# Patient Record
Sex: Female | Born: 2000 | Race: Black or African American | Hispanic: No | Marital: Single | Smoking: Never smoker
Health system: Southern US, Community
[De-identification: ages and names within clinical notes are randomized; demographics above are authoritative.]

## PROBLEM LIST (undated history)

## (undated) DIAGNOSIS — L309 Dermatitis, unspecified: Secondary | ICD-10-CM

---
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
## 2019-05-04 ENCOUNTER — Encounter: Payer: Self-pay | Admitting: Gynecology

## 2019-06-08 ENCOUNTER — Other Ambulatory Visit: Payer: Self-pay

## 2019-06-09 ENCOUNTER — Encounter: Payer: Self-pay | Admitting: Women's Health

## 2019-06-09 ENCOUNTER — Ambulatory Visit: Payer: PRIVATE HEALTH INSURANCE | Admitting: Women's Health

## 2019-06-09 VITALS — BP 120/80

## 2019-06-09 DIAGNOSIS — Z113 Encounter for screening for infections with a predominantly sexual mode of transmission: Secondary | ICD-10-CM

## 2019-06-09 DIAGNOSIS — N898 Other specified noninflammatory disorders of vagina: Secondary | ICD-10-CM | POA: Diagnosis not present

## 2019-06-09 LAB — WET PREP FOR TRICH, YEAST, CLUE

## 2019-06-09 MED ORDER — FLUCONAZOLE 150 MG PO TABS: 150.0000 mg | ORAL_TABLET | Freq: Once | ORAL | 1 refills | Status: AC

## 2019-06-09 NOTE — Patient Instructions (Signed)
Vaginal Yeast infection, Adult  Vaginal yeast infection is a condition that causes vaginal discharge as well as soreness, swelling, and redness (inflammation) of the vagina. This is a common condition. Some women get this infection frequently. What are the causes? This condition is caused by a change in the normal balance of the yeast (candida) and bacteria that live in the vagina. This change causes an overgrowth of yeast, which causes the inflammation. What increases the risk? The condition is more likely to develop in women who:  Take antibiotic medicines.  Have diabetes.  Take birth control pills.  Are pregnant.  Douche often.  Have a weak body defense system (immune system).  Have been taking steroid medicines for a long time.  Frequently wear tight clothing. What are the signs or symptoms? Symptoms of this condition include:  White, thick, creamy vaginal discharge.  Swelling, itching, redness, and irritation of the vagina. The lips of the vagina (vulva) may be affected as well.  Pain or a burning feeling while urinating.  Pain during sex. How is this diagnosed? This condition is diagnosed based on:  Your medical history.  A physical exam.  A pelvic exam. Your health care provider will examine a sample of your vaginal discharge under a microscope. Your health care provider may send this sample for testing to confirm the diagnosis. How is this treated? This condition is treated with medicine. Medicines may be over-the-counter or prescription. You may be told to use one or more of the following:  Medicine that is taken by mouth (orally).  Medicine that is applied as a cream (topically).  Medicine that is inserted directly into the vagina (suppository). Follow these instructions at home:  Lifestyle  Do not have sex until your health care provider approves. Tell your sex partner that you have a yeast infection. That person should go to his or her health care  provider and ask if they should also be treated.  Do not wear tight clothes, such as pantyhose or tight pants.  Wear breathable cotton underwear. General instructions  Take or apply over-the-counter and prescription medicines only as told by your health care provider.  Eat more yogurt. This may help to keep your yeast infection from returning.  Do not use tampons until your health care provider approves.  Try taking a sitz bath to help with discomfort. This is a warm water bath that is taken while you are sitting down. The water should only come up to your hips and should cover your buttocks. Do this 3-4 times per day or as told by your health care provider.  Do not douche.  If you have diabetes, keep your blood sugar levels under control.  Keep all follow-up visits as told by your health care provider. This is important. Contact a health care provider if:  You have a fever.  Your symptoms go away and then return.  Your symptoms do not get better with treatment.  Your symptoms get worse.  You have new symptoms.  You develop blisters in or around your vagina.  You have blood coming from your vagina and it is not your menstrual period.  You develop pain in your abdomen. Summary  Vaginal yeast infection is a condition that causes discharge as well as soreness, swelling, and redness (inflammation) of the vagina.  This condition is treated with medicine. Medicines may be over-the-counter or prescription.  Take or apply over-the-counter and prescription medicines only as told by your health care provider.  Do not douche.   Do not have sex or use tampons until your health care provider approves.  Contact a health care provider if your symptoms do not get better with treatment or your symptoms go away and then return. This information is not intended to replace advice given to you by your health care provider. Make sure you discuss any questions you have with your health care  provider. Document Released: 05/09/2005 Document Revised: 12/16/2017 Document Reviewed: 12/16/2017 Elsevier Patient Education  2020 [employer] Inc[employer]  

## 2019-06-09 NOTE — Progress Notes (Signed)
18 year old S BF G0 presents with complaint of white vaginal discharge with odor for 1 week and requesting STD screen, new partner.  10/2018 Nexplanon amenorrheic, second Nexplanon.  Denies urinary symptoms of frequency, pain/ burning, back/abdominal pain or fever.  Gardasil series completed.  Student at [employer], doing well..  Exam: Appears well.  Obese.  No CVAT or abdominal pain with palpation.  External genitalia within normal limits, speculum exam scant white discharge, wet prep positive for yeast, GC/chlamydia culture taken.  Bimanual no CMT tenderness.  Yeast vaginitis STD screen  Plan: Diflucan 150 p.o. today repeat in 3 days if necessary prescription, proper use given and reviewed.  Yeast prevention discussed.  Encouraged condoms until permanent partner.  Aware of importance of increasing exercise and decreasing calories/carbs.  GC/chlamydia culture pending, HIV, RPR.

## 2019-06-10 LAB — GC PROBE AMP THINPREP: N. gonorrhoeae RNA, TMA: NOT DETECTED

## 2019-10-26 ENCOUNTER — Other Ambulatory Visit: Payer: Self-pay

## 2019-10-26 ENCOUNTER — Encounter: Payer: Self-pay | Admitting: Women's Health

## 2019-10-26 ENCOUNTER — Ambulatory Visit: Payer: PRIVATE HEALTH INSURANCE | Admitting: Women's Health

## 2019-10-26 VITALS — BP 122/80 | Ht 62.0 in | Wt 292.0 lb

## 2019-10-26 DIAGNOSIS — Z113 Encounter for screening for infections with a predominantly sexual mode of transmission: Secondary | ICD-10-CM

## 2019-10-26 DIAGNOSIS — R5383 Other fatigue: Secondary | ICD-10-CM | POA: Diagnosis not present

## 2019-10-26 DIAGNOSIS — Z01419 Encounter for gynecological examination (general) (routine) without abnormal findings: Secondary | ICD-10-CM | POA: Diagnosis not present

## 2019-10-26 NOTE — Progress Notes (Signed)
Donovan Gatchel 06-13-2001 [id number]    History:    Presents for annual exam.  Irregular spotting on Nexplanon placed 10/2018.  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  No feelings of self-harm.  Gardasil series completed.  Negative GC/chlamydia 05/2019 same partner.    Past medical history, past surgical history, family history and social history were all reviewed and documented in the EPIC chart.  Parents also obese, mother hypertension.  ROS:  A ROS was performed and pertinent positives and negatives are included.  Exam:  Vitals:   10/26/19 1421  BP: 122/80  Weight: 292 lb (132.5 kg)  Height: 5\' 2"  (1.575 m)   Body mass index is 53.41 kg/m.   General appearance:  Normal Thyroid:  Symmetrical, normal in size, without palpable masses or nodularity. Respiratory  Auscultation:  Clear without wheezing or rhonchi Cardiovascular  Auscultation:  Regular rate, without rubs, murmurs or gallops  Edema/varicosities:  Not grossly evident Abdominal  Soft,nontender, without masses, guarding or rebound.  Liver/spleen:  No organomegaly noted  Hernia:  None appreciated  Skin  Inspection:  Grossly normal   Breasts: Examined lying and sitting.     Right: Without masses, retractions, discharge or axillary adenopathy.     Left: Without masses, retractions, discharge or axillary adenopathy. Gentitourinary   Inguinal/mons:  Normal without inguinal adenopathy  External genitalia:  Normal  BUS/Urethra/Skene's glands:  Normal  Vagina:  Normal  Cervix:  Normal  Uterus:   normal in size, shape and contour.  Midline and mobile  Adnexa/parametria:     Rt: Without masses or tenderness.   Lt: Without masses or tenderness.  Anus and perineum: Normal    Assessment/Plan:  19 y.o. S BF G0  for annual exam with no GYN complaints but reports questionable depression, fatigue, tearfulness mostly in regard to situational stressors of school.  10/2018 Nexplanon rare bleeding Obesity  Plan: Encourage counseling, instructed to see if any counseling is available for University to help with career choices.  Self-care, leisure activities encouraged.  Reviewed most likely tearfulness and anxiety related to uncertain career path.  Aware of need to decrease calories/carbs, increase daily walking.  CBC, TSH, HIV, RPR.  Denies need for repeat GC/chlamydia same partner.    11/2018 [facility], 6:28 PM 10/26/2019

## 2019-10-26 NOTE — Patient Instructions (Signed)
MVI Take walk daily Health Maintenance, Female Adopting a healthy lifestyle and getting preventive care are important in promoting health and wellness. Ask your health care provider about:  The right schedule for you to have regular tests and exams.  Things you can do on your own to prevent diseases and keep yourself healthy. What should I know about diet, weight, and exercise? Eat a healthy diet   Eat a diet that includes plenty of vegetables, fruits, low-fat dairy products, and lean protein.  Do not eat a lot of foods that are high in solid fats, added sugars, or sodium. Maintain a healthy weight Body mass index (BMI) is used to identify weight problems. It estimates body fat based on height and weight. Your health care provider can help determine your BMI and help you achieve or maintain a healthy weight. Get regular exercise Get regular exercise. This is one of the most important things you can do for your health. Most adults should:  Exercise for at least 150 minutes each week. The exercise should increase your heart rate and make you sweat (moderate-intensity exercise).  Do strengthening exercises at least twice a week. This is in addition to the moderate-intensity exercise.  Spend less time sitting. Even light physical activity can be beneficial. Watch cholesterol and blood lipids Have your blood tested for lipids and cholesterol at 20 years of age, then have this test every 5 years. Have your cholesterol levels checked more often if:  Your lipid or cholesterol levels are high.  You are older than 20 years of age.  You are at high risk for heart disease. What should I know about cancer screening? Depending on your health history and family history, you may need to have cancer screening at various ages. This may include screening for:  Breast cancer.  Cervical cancer.  Colorectal cancer.  Skin cancer.  Lung cancer. What should I know about heart disease, diabetes,  and high blood pressure? Blood pressure and heart disease  High blood pressure causes heart disease and increases the risk of stroke. This is more likely to develop in people who have high blood pressure readings, are of African descent, or are overweight.  Have your blood pressure checked: ? Every 3-5 years if you are 67-67 years of age. ? Every year if you are 40 years old or older. Diabetes Have regular diabetes screenings. This checks your fasting blood sugar level. Have the screening done:  Once every three years after age 61 if you are at a normal weight and have a low risk for diabetes.  More often and at a younger age if you are overweight or have a high risk for diabetes. What should I know about preventing infection? Hepatitis B If you have a higher risk for hepatitis B, you should be screened for this virus. Talk with your health care provider to find out if you are at risk for hepatitis B infection. Hepatitis C Testing is recommended for:  Everyone born from [birth year] through 1966.  Anyone with known risk factors for hepatitis C. Sexually transmitted infections (STIs)  Get screened for STIs, including gonorrhea and chlamydia, if: ? You are sexually active and are younger than 20 years of age. ? You are older than 20 years of age and your health care provider tells you that you are at risk for this type of infection. ? Your sexual activity has changed since you were last screened, and you are at increased risk for chlamydia or gonorrhea. Ask your  health care provider if you are at risk.  Ask your health care provider about whether you are at high risk for HIV. Your health care provider may recommend a prescription medicine to help prevent HIV infection. If you choose to take medicine to prevent HIV, you should first get tested for HIV. You should then be tested every 3 months for as long as you are taking the medicine. Pregnancy  If you are about to stop having your period  (premenopausal) and you may become pregnant, seek counseling before you get pregnant.  Take 400 to 800 micrograms (mcg) of folic acid every day if you become pregnant.  Ask for birth control (contraception) if you want to prevent pregnancy. Osteoporosis and menopause Osteoporosis is a disease in which the bones lose minerals and strength with aging. This can result in bone fractures. If you are 26 years old or older, or if you are at risk for osteoporosis and fractures, ask your health care provider if you should:  Be screened for bone loss.  Take a calcium or vitamin D supplement to lower your risk of fractures.  Be given hormone replacement therapy (HRT) to treat symptoms of menopause. Follow these instructions at home: Lifestyle  Do not use any products that contain nicotine or tobacco, such as cigarettes, e-cigarettes, and chewing tobacco. If you need help quitting, ask your health care provider.  Do not use street drugs.  Do not share needles.  Ask your health care provider for help if you need support or information about quitting drugs. Alcohol use  Do not drink alcohol if: ? Your health care provider tells you not to drink. ? You are pregnant, may be pregnant, or are planning to become pregnant.  If you drink alcohol: ? Limit how much you use to 0-1 drink a day. ? Limit intake if you are breastfeeding.  Be aware of how much alcohol is in your drink. In the [place] one drink equals one 12 oz bottle of beer (355 mL), one 5 oz glass of wine (148 mL), or one 1 oz glass of hard liquor (44 mL). General instructions  Schedule regular health, dental, and eye exams.  Stay current with your vaccines.  Tell your health care provider if: ? You often feel depressed. ? You have ever been abused or do not feel safe at home. Summary  Adopting a healthy lifestyle and getting preventive care are important in promoting health and wellness.  Follow your health care provider's  instructions about healthy diet, exercising, and getting tested or screened for diseases.  Follow your health care provider's instructions on monitoring your cholesterol and blood pressure. This information is not intended to replace advice given to you by your health care provider. Make sure you discuss any questions you have with your health care provider. Document Revised: 07/23/2018 Document Reviewed: 07/23/2018 Elsevier Patient Education  2020 [employer].

## 2019-12-30 ENCOUNTER — Ambulatory Visit: Payer: PRIVATE HEALTH INSURANCE | Admitting: Obstetrics and Gynecology

## 2019-12-30 ENCOUNTER — Encounter: Payer: Self-pay | Admitting: Obstetrics and Gynecology

## 2019-12-30 ENCOUNTER — Other Ambulatory Visit: Payer: Self-pay

## 2019-12-30 VITALS — BP 118/76

## 2019-12-30 DIAGNOSIS — Z113 Encounter for screening for infections with a predominantly sexual mode of transmission: Secondary | ICD-10-CM

## 2019-12-30 DIAGNOSIS — N898 Other specified noninflammatory disorders of vagina: Secondary | ICD-10-CM | POA: Diagnosis not present

## 2019-12-30 LAB — WET PREP FOR TRICH, YEAST, CLUE

## 2019-12-30 NOTE — Progress Notes (Signed)
   Debbie Dawson 2000/10/06 [id number]  SUBJECTIVE:  19 y.o. Dawson female presents for vaginal discharge.  She notices slightly "tented" discharge which she is not sure of the color, only in the morning.  She is sexually active with one partner, no new partners.  She denies any vaginal irritation, itching, or odor.  Nexplanon placed 10/2018.  Currently amenorrheic.  Current Outpatient Medications  Medication Sig Dispense Refill  . etonogestrel (NEXPLANON) 68 MG IMPL implant 1 each by Subdermal route once.     No current facility-administered medications for this visit.   Allergies: Peanuts [peanut oil]  No LMP recorded. Patient has had an implant.  Past medical history,surgical history, problem list, medications, allergies, family history and social history were all reviewed and documented as reviewed in the EPIC chart.   OBJECTIVE:  BP 118/76  The patient appears well, alert, oriented x 3, in no distress. PELVIC EXAM: VULVA: normal appearing vulva with no masses, tenderness or lesions, VAGINA: normal appearing vagina with normal color and discharge, no lesions, CERVIX: normal appearing cervix without discharge or lesions,  Vaginal wet mount negative for hyphae, clue cells, trichomonads, no WBC, moderate bacteria, 7-12 epithelial cells/hpf Chaperone: Kennon Portela present during the examination  ASSESSMENT:  Debbie Dawson here for vaginal discharge  PLAN:  The patient is reassured by the negative vaginal wet mount.  We did also collect a gonorrhea and chlamydia probe and will share the results of that when available.  She is not having any other symptoms and this may be just a hormonal or vaginal microbiome change.  If persistent/bothersome, could consider probiotics.   [name] MD 12/30/19

## 2019-12-31 ENCOUNTER — Ambulatory Visit: Payer: PRIVATE HEALTH INSURANCE | Admitting: Nurse Practitioner

## 2019-12-31 LAB — C. TRACHOMATIS/N. GONORRHOEAE RNA
C. trachomatis RNA, TMA: NOT DETECTED
N. gonorrhoeae RNA, TMA: NOT DETECTED

## 2021-10-24 ENCOUNTER — Encounter: Payer: Self-pay | Admitting: Nurse Practitioner

## 2021-10-24 ENCOUNTER — Other Ambulatory Visit: Payer: Self-pay

## 2021-10-24 ENCOUNTER — Ambulatory Visit (INDEPENDENT_AMBULATORY_CARE_PROVIDER_SITE_OTHER): Payer: Managed Care, Other (non HMO) | Admitting: Nurse Practitioner

## 2021-10-24 VITALS — BP 124/82

## 2021-10-24 DIAGNOSIS — Z30015 Encounter for initial prescription of vaginal ring hormonal contraceptive: Secondary | ICD-10-CM

## 2021-10-24 DIAGNOSIS — Z3046 Encounter for surveillance of implantable subdermal contraceptive: Secondary | ICD-10-CM

## 2021-10-24 MED ORDER — ETONOGESTREL-ETHINYL ESTRADIOL 0.12-0.015 MG/24HR VA RING: 1.0000 | VAGINAL_RING | VAGINAL | 0 refills | Status: AC

## 2021-10-24 NOTE — Progress Notes (Signed)
21 y.o. G0P0000 Single female presents for Nexplanon removal.  She has experienced a 50 pound weight gain since first using Nexplanon 6 years go. Device expires this month. She has decided to use Nuvaring for future contraception. She has used Nuvaring before. Procedure, risks and benefits have all been explained.  She has the following questions today:  None.    ? ?LMP:  UNK, implant in place  ? ?After all questions were answered, consent was obtained.   ? ?Past Medical History:  ?Diagnosis Date  ? Eczema   ? ? ?Past Surgical History:  ?Procedure Laterality Date  ? Nexplanon placement  10/22/2018  ? ? ?Current Outpatient Medications on File Prior to Visit  ?Medication Sig Dispense Refill  ? etonogestrel (NEXPLANON) 68 MG IMPL implant 1 each by Subdermal route once.    ? ?No current facility-administered medications on file prior to visit.  ? ?Allergies  ?Allergen Reactions  ? Peanuts [Peanut Oil] Other (See Comments)  ?  Unknown  ? ? ?Vitals:  ? 10/24/21 1423  ?BP: 124/82  ? ?Physical Exam ? ?Procedure: Patient placed supine on exam table with her left arm flexed at the elbow. The prior insertion site was located and the Nexplanon rod was palpated.  Area cleansed with Betadine x 3 and draped in normal sterile fashion.  Insertion site and surrounding tissue anesthetized with 1% Lidocaine without epinephrine, 2cc total used.  Small incision made with #11 blade.  Nexplanon removed without difficulty.  Steri-strips were applied and pressure dressing placed over the site.  Entire procedure performed with sterile technique.  Pt tolerated procedure well. ? ?Assessment: Nexplanon removal ? ?Plan:  Post procedure instructions reviewed with pt.  Questions answered.  Pt knows to call with any concerns or questions.  ?New contraception:  Nuvaring  ? ?Contraceptive options were reviewed, including hormonal methods, both combination (pill, patch, vaginal ring) and progesterone-only (pill, Depo Provera and Nexplanon),  intrauterine devices (Mirena, Malakoff, Owings, and Parker), barrier methods (condoms, diaphragm) and female/female sterilization. The mechanisms, risks, benefits and side effects of all methods were discussed. Will avoid patch and depo due to high BMI. She has used Nuvaring before and wants to do this again. ?

## 2021-10-26 ENCOUNTER — Encounter: Payer: Self-pay | Admitting: Gynecology
